# Patient Record
Sex: Male | Born: 1995 | Hispanic: No | Marital: Single | Smoking: Never smoker
Health system: Southern US, Community
[De-identification: ages and names within clinical notes are randomized; demographics above are authoritative.]

## PROBLEM LIST (undated history)

## (undated) HISTORY — PX: KNEE ARTHROSCOPY WITH ANTERIOR CRUCIATE LIGAMENT (ACL) REPAIR: SHX5644

---
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
## 2015-09-23 ENCOUNTER — Encounter (HOSPITAL_BASED_OUTPATIENT_CLINIC_OR_DEPARTMENT_OTHER): Payer: Self-pay | Admitting: Emergency Medicine

## 2015-09-23 ENCOUNTER — Emergency Department (HOSPITAL_BASED_OUTPATIENT_CLINIC_OR_DEPARTMENT_OTHER)
Admission: EM | Admit: 2015-09-23 | Discharge: 2015-09-23 | Discharge status: Against Medical Advice | Payer: Self-pay | Attending: Emergency Medicine | Admitting: Emergency Medicine

## 2015-09-23 DIAGNOSIS — S0592XA Unspecified injury of left eye and orbit, initial encounter: Secondary | ICD-10-CM | POA: Insufficient documentation

## 2015-09-23 DIAGNOSIS — W228XXA Striking against or struck by other objects, initial encounter: Secondary | ICD-10-CM | POA: Insufficient documentation

## 2015-09-23 DIAGNOSIS — Y9289 Other specified places as the place of occurrence of the external cause: Secondary | ICD-10-CM | POA: Insufficient documentation

## 2015-09-23 DIAGNOSIS — Y998 Other external cause status: Secondary | ICD-10-CM | POA: Insufficient documentation

## 2015-09-23 DIAGNOSIS — Y9389 Activity, other specified: Secondary | ICD-10-CM | POA: Insufficient documentation

## 2015-09-23 NOTE — ED Notes (Signed)
Pt reports he is feeling better and wishes to leave. Advised he can return at any time. Pt cao x 4. Ambulatory with steady gait.
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
## 2016-08-20 ENCOUNTER — Encounter (HOSPITAL_BASED_OUTPATIENT_CLINIC_OR_DEPARTMENT_OTHER): Payer: Self-pay

## 2016-08-20 ENCOUNTER — Emergency Department (HOSPITAL_BASED_OUTPATIENT_CLINIC_OR_DEPARTMENT_OTHER): Payer: Self-pay

## 2016-08-20 ENCOUNTER — Emergency Department (HOSPITAL_BASED_OUTPATIENT_CLINIC_OR_DEPARTMENT_OTHER)
Admission: EM | Admit: 2016-08-20 | Discharge: 2016-08-20 | Disposition: A | Discharge status: Home / Self Care | Payer: Self-pay | Attending: Emergency Medicine | Admitting: Emergency Medicine

## 2016-08-20 DIAGNOSIS — Y9366 Activity, soccer: Secondary | ICD-10-CM | POA: Insufficient documentation

## 2016-08-20 DIAGNOSIS — Y929 Unspecified place or not applicable: Secondary | ICD-10-CM | POA: Insufficient documentation

## 2016-08-20 DIAGNOSIS — S8391XA Sprain of unspecified site of right knee, initial encounter: Secondary | ICD-10-CM | POA: Insufficient documentation

## 2016-08-20 DIAGNOSIS — Y999 Unspecified external cause status: Secondary | ICD-10-CM | POA: Insufficient documentation

## 2016-08-20 DIAGNOSIS — X501XXA Overexertion from prolonged static or awkward postures, initial encounter: Secondary | ICD-10-CM | POA: Insufficient documentation

## 2016-08-20 MED ORDER — IBUPROFEN 600 MG PO TABS: 600.0000 mg | ORAL_TABLET | Freq: Four times a day (QID) | ORAL | 0 refills | Status: AC | PRN

## 2016-08-20 MED ORDER — IBUPROFEN 400 MG PO TABS
600.0000 mg | ORAL_TABLET | Freq: Once | ORAL | Status: AC
  Administered 2016-08-20: 600 mg via ORAL

## 2016-08-20 MED ORDER — IBUPROFEN 200 MG PO TABS
ORAL_TABLET | ORAL | Status: DC
Start: 2016-08-20 — End: 2016-08-21
  Filled 2016-08-20: qty 1

## 2016-08-20 MED ORDER — IBUPROFEN 400 MG PO TABS
ORAL_TABLET | ORAL | Status: AC
  Filled 2016-08-20: qty 1

## 2016-08-20 NOTE — ED Provider Notes (Addendum)
[facility] Provider Note   CSN: [id number] Arrival date & time: 08/20/16  2106  By signing my name below, I, Jasmyn B. Alexander, attest that this documentation has been prepared under the direction and in the presence of Pricilla LovelessScott Solly Derasmo, MD. Electronically Signed: Gillis EndsJasmyn B. Lyn HollingsheadAlexander, ED Scribe. 08/20/16. 10:45 PM.  History   Chief Complaint Chief Complaint  Patient presents with  . Knee Injury    The history is provided by the patient. No language interpreter was used.   HPI Comments: Mitchell Elliott is a 20 y.o. male who presents to the Emergency Department complaining of sudden onset, constant, severe right knee pain x 3 hrs PTA. Pt states he was running while playing soccer, and when he did a quick twisting movement he heard a "pop" in his knee. Pt has associated swelling of his right knee. Pain is exacerbated when he bears weight and with movement of his right leg. No alleviating factors noted. Denies any numbness to right lower extremity.   History reviewed. No pertinent past medical history.  There are no active problems to display for this patient.  History reviewed. No pertinent surgical history.   Home Medications    Prior to Admission medications   Medication Sig Start Date End Date Taking? Authorizing Provider  ibuprofen (ADVIL,MOTRIN) 600 MG tablet Take 1 tablet (600 mg total) by mouth every 6 (six) hours as needed. 08/20/16   Pricilla LovelessScott Tene Gato, MD    Family History No family history on file.  Social History Social History  Substance Use Topics  . Smoking status: Never Smoker  . Smokeless tobacco: Never Used  . Alcohol use No    Allergies   Review of patient's allergies indicates no known allergies.   Review of Systems Review of Systems  Musculoskeletal: Positive for joint swelling and myalgias.  Neurological: Negative for numbness.  All other systems reviewed and are negative.  Physical Exam Updated Vital Signs BP 115/77   Pulse 76   Temp  98.2 F (36.8 C) (Oral)   Resp 16   Ht 5\' 7"  (1.702 m)   Wt 147 lb (66.7 kg)   SpO2 100%   BMI 23.02 kg/m   Physical Exam  Constitutional: He is oriented to person, place, and time. He appears well-developed and well-nourished.  HENT:  Head: Normocephalic and atraumatic.  Right Ear: External ear normal.  Left Ear: External ear normal.  Nose: Nose normal.  Eyes: Right eye exhibits no discharge. Left eye exhibits no discharge.  Neck: Neck supple.  Cardiovascular: Normal rate and intact distal pulses.   2+ DP Pulse on the right   Pulmonary/Chest: Effort normal.  Abdominal: He exhibits no distension.  Musculoskeletal: He exhibits tenderness.       Right knee: He exhibits swelling. Tenderness found. Medial joint line and lateral joint line tenderness noted.  No significant laxity of his knee joints.  Neurological: He is alert and oriented to person, place, and time.  Skin: Skin is warm and dry.  Nursing note and vitals reviewed.  ED Treatments / Results  DIAGNOSTIC STUDIES: Oxygen Saturation is 100% on RA, normal by my interpretation.    COORDINATION OF CARE: 10:40 PM-Discussed treatment plan which includes order of Ibuprofen, leg immobilization with crutches, pain management methods and referral to Orthopedist with pt at bedside and pt agreed to plan.   Radiology Dg Knee Complete 4 Views Right  Result Date: 08/20/2016 CLINICAL DATA:  Twisting injury playing soccer today. EXAM: RIGHT KNEE - COMPLETE 4+ VIEW COMPARISON:  None. FINDINGS: No evidence of fracture, dislocation, or joint effusion. No evidence of arthropathy or other focal bone abnormality. Probable small joint effusion. IMPRESSION: Negative for fracture or dislocation. Probable small knee joint effusion. Electronically Signed   By: Ellery Plunk M.D.   On: 08/20/2016 21:50   Procedures Procedures (including critical care time)  Medications Ordered in ED Medications  ibuprofen (ADVIL,MOTRIN) 400 MG tablet (  Not  Given 08/20/16 2305)  ibuprofen (ADVIL,MOTRIN) 200 MG tablet (  Not Given 08/20/16 2304)  ibuprofen (ADVIL,MOTRIN) tablet 600 mg (600 mg Oral Given 08/20/16 2227)   Initial Impression / Assessment and Plan / ED Course  I have reviewed the triage vital signs and the nursing notes.  Pertinent labs & imaging results that were available during my care of the patient were reviewed by me and considered in my medical decision making (see chart for details).  Clinical Course    Patient presents with a right knee sprain. Moderate swelling present. Treat with NSAIDs, Tylenol, ice, elevation. He has been unable to bear weight, we'll place a knee immobilizer with ambulation as well as crutches. Follow-up with orthopedics. X-ray without fracture.  Final Clinical Impressions(s) / ED Diagnoses   Final diagnoses:  Right knee sprain, initial encounter    New Prescriptions New Prescriptions   IBUPROFEN (ADVIL,MOTRIN) 600 MG TABLET    Take 1 tablet (600 mg total) by mouth every 6 (six) hours as needed.   I personally performed the services described in this documentation, which was scribed in my presence. The recorded information has been reviewed and is accurate.     Pricilla Loveless, MD 08/20/16 8119    Pricilla Loveless, MD 08/20/16 [id number]

## 2016-08-20 NOTE — ED Triage Notes (Addendum)
Injured right ankle playing soccer just PTA-states he "heard a pop" right ankle-limping gait-placed in w/c after triage

## 2016-08-20 NOTE — ED Triage Notes (Signed)
When taken to tx area pt states that only injury/pain is to right knee-denies ankle injury-ice pack given

## 2016-08-21 ENCOUNTER — Ambulatory Visit (INDEPENDENT_AMBULATORY_CARE_PROVIDER_SITE_OTHER): Payer: Self-pay | Admitting: Family Medicine

## 2016-08-21 ENCOUNTER — Encounter: Payer: Self-pay | Admitting: Family Medicine

## 2016-08-21 DIAGNOSIS — S8991XA Unspecified injury of right lower leg, initial encounter: Secondary | ICD-10-CM

## 2016-08-21 NOTE — Patient Instructions (Signed)
I'm concerned you have an 'unhappy triad' injury of your knee - ACL, MCL, meniscus (cartilage). We will go ahead with an MRI of your knee. Wear the immobilizer when you're up and walking around. Crutches as well. Icing 15 minutes at a time 3-4 times a day. Ibuprofen 600mg  three times a day with food OR aleve 2 tabs twice a day with food for pain and inflammation. I will call you with results and next steps. Do some straight leg raises in the brace to keep hip and quad strength 3 sets of 10 once or twice a day.

## 2016-08-24 ENCOUNTER — Encounter: Payer: Self-pay | Admitting: Family Medicine

## 2016-08-26 ENCOUNTER — Telehealth: Payer: Self-pay | Admitting: Family Medicine

## 2016-08-26 DIAGNOSIS — S8991XA Unspecified injury of right lower leg, initial encounter: Secondary | ICD-10-CM | POA: Insufficient documentation
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
## 2016-08-26 NOTE — Assessment & Plan Note (Signed)
concerning for unhappy triad injury (ACL, MCL, meniscus).  Continue with immobilizer, crutches, icing, nsaids.  Shown home exercises to start.  He will obtain cone coverage to go ahead with MRI.

## 2016-08-26 NOTE — Telephone Encounter (Signed)
Spoke to patient and told him to bring insurance card to the office and we can make a copy then order MRI.

## 2016-08-27 ENCOUNTER — Telehealth: Payer: Self-pay | Admitting: Family Medicine

## 2016-08-28 NOTE — Telephone Encounter (Signed)
Spoke to patient and told him that we would let him know when we hear from the insurance company.

## 2016-08-29 NOTE — Addendum Note (Signed)
Addended by: Kathi SimpersWISE, Vera Furniss F on: 08/29/2016 10:06 AM   Modules accepted: Orders

## 2016-08-30 ENCOUNTER — Ambulatory Visit (HOSPITAL_BASED_OUTPATIENT_CLINIC_OR_DEPARTMENT_OTHER)
Admission: RE | Admit: 2016-08-30 | Discharge: 2016-08-30 | Disposition: A | Discharge status: Home / Self Care | Payer: BLUE CROSS/BLUE SHIELD | Source: Ambulatory Visit | Attending: Family Medicine | Admitting: Family Medicine

## 2016-08-30 DIAGNOSIS — S8011XA Contusion of right lower leg, initial encounter: Secondary | ICD-10-CM | POA: Insufficient documentation

## 2016-08-30 DIAGNOSIS — S7011XA Contusion of right thigh, initial encounter: Secondary | ICD-10-CM | POA: Insufficient documentation

## 2016-08-30 DIAGNOSIS — M25461 Effusion, right knee: Secondary | ICD-10-CM | POA: Diagnosis not present

## 2016-08-30 DIAGNOSIS — S8991XA Unspecified injury of right lower leg, initial encounter: Secondary | ICD-10-CM | POA: Diagnosis present

## 2016-08-30 DIAGNOSIS — S83511A Sprain of anterior cruciate ligament of right knee, initial encounter: Secondary | ICD-10-CM | POA: Insufficient documentation

## 2016-08-30 DIAGNOSIS — X58XXXA Exposure to other specified factors, initial encounter: Secondary | ICD-10-CM | POA: Diagnosis not present

## 2016-09-01 ENCOUNTER — Telehealth: Payer: Self-pay | Admitting: Family Medicine

## 2016-09-01 NOTE — Telephone Encounter (Signed)
Spoke with patient - see addendum in chart.

## 2016-09-01 NOTE — Addendum Note (Signed)
Addended by: Kathi SimpersWISE, Casmira Cramer F on: 09/01/2016 03:38 PM   Modules accepted: Orders

## 2017-02-01 ENCOUNTER — Emergency Department (HOSPITAL_BASED_OUTPATIENT_CLINIC_OR_DEPARTMENT_OTHER)
Admission: EM | Admit: 2017-02-01 | Discharge: 2017-02-01 | Disposition: A | Discharge status: Home / Self Care | Payer: BLUE CROSS/BLUE SHIELD | Attending: Emergency Medicine | Admitting: Emergency Medicine

## 2017-02-01 ENCOUNTER — Encounter (HOSPITAL_BASED_OUTPATIENT_CLINIC_OR_DEPARTMENT_OTHER): Payer: Self-pay | Admitting: Emergency Medicine

## 2017-02-01 ENCOUNTER — Emergency Department (HOSPITAL_BASED_OUTPATIENT_CLINIC_OR_DEPARTMENT_OTHER): Payer: BLUE CROSS/BLUE SHIELD

## 2017-02-01 DIAGNOSIS — J329 Chronic sinusitis, unspecified: Secondary | ICD-10-CM | POA: Insufficient documentation

## 2017-02-01 DIAGNOSIS — R05 Cough: Secondary | ICD-10-CM

## 2017-02-01 DIAGNOSIS — R059 Cough, unspecified: Secondary | ICD-10-CM

## 2017-02-01 MED ORDER — GUAIFENESIN-CODEINE 100-10 MG/5ML PO SOLN: 5.0000 mL | Freq: Three times a day (TID) | ORAL | 0 refills | Status: AC | PRN

## 2017-02-01 MED ORDER — ACETAMINOPHEN 325 MG PO TABS
650.0000 mg | ORAL_TABLET | Freq: Once | ORAL | Status: AC | PRN
  Administered 2017-02-01: 650 mg via ORAL
  Filled 2017-02-01: qty 2

## 2017-02-01 MED ORDER — AMOXICILLIN-POT CLAVULANATE 875-125 MG PO TABS
1.0000 | ORAL_TABLET | Freq: Two times a day (BID) | ORAL | 0 refills | Status: AC
Start: 2017-02-01 — End: ?

## 2017-02-01 NOTE — Discharge Instructions (Signed)
Your chest x-ray was normal. This is likely a bacterial sinus infection. Please take the Augmentin 2 times a day for 7 days for infection. Have given used some cough syrup. This does have some pain medicine in it so it will make you drowsy. Do not drive with this medication. Use over-the-counter Mucinex for chest congestion. Take Motrin and Tylenol for fever and pain. Make sure you're staying hydrated with plenty of fluids. Follow-up with her primary care doctor or return to the ED if your symptoms worsen.

## 2017-02-01 NOTE — ED Notes (Addendum)
EDPA into room, pt alert, NAD, calm, interactive, resps e/u, speaking in clear complete sentences, skin W&D, no dyspnea noted. Reports nasal congestion, and teeth pain upper > lower, L > R.

## 2017-02-01 NOTE — ED Provider Notes (Signed)
[facility]   CSN: [id number] Arrival date & time: 02/01/17  1620  By signing my name below, I, Modena Jansky, attest that this documentation has been prepared under the direction and in the presence of non-physician practitioner, Azucena Kuba, PA-C. Electronically Signed: Modena Jansky, Scribe. 02/01/2017. 6:29 PM.  History   Chief Complaint Chief Complaint  Patient presents with  . Cough   The history is provided by the patient. No language interpreter was used.   HPI Comments: Mitchell Elliott is a 21 y.o. male who presents to the Emergency Department complaining of intermittent moderate cough that started about a week ago. He states he has been having gradually worsening URI-like symptoms. He started having dental pain yesterday that worsened today. He complains of sinus pressure and congestion. States that the sinus pressure is radiating to his teeth. Endorses thick yellow nasal discharge. Significant congestion. He has been taking nyquil, dayquil, and tylenol with minimal relief. He describes the cough as productive. He reports associated fever (subjective), nasal congestion, and generalized myalgias. Pt's temperature in the ED today was 101.4. He denies any influenza vaccination this year, ear pain, sore throat , chest pain, shortness of breath, abdominal pain, nausea, emesis, lightheadedness, dizziness, vision changes or other complaints.    History reviewed. No pertinent past medical history.  Patient Active Problem List   Diagnosis Date Noted  . Right knee injury 08/26/2016    Past Surgical History:  Procedure Laterality Date  . KNEE ARTHROSCOPY WITH ANTERIOR CRUCIATE LIGAMENT (ACL) REPAIR         Home Medications    Prior to Admission medications   Medication Sig Start Date End Date Taking? Authorizing Provider  albuterol (PROVENTIL HFA;VENTOLIN HFA) 108 (90 BASE) MCG/ACT inhaler Inhale 2 puffs into the lungs every 4 (four) hours as needed for  shortness of breath. 11/05/13   Amber Nydia Bouton, MD  ibuprofen (ADVIL,MOTRIN) 600 MG tablet Take 1 tablet (600 mg total) by mouth every 6 (six) hours as needed. 08/20/16   Pricilla Loveless, MD    Family History History reviewed. No pertinent family history.  Social History Social History  Substance Use Topics  . Smoking status: Never Smoker  . Smokeless tobacco: Never Used  . Alcohol use No     Allergies   Patient has no known allergies.   Review of Systems Review of Systems  Constitutional: Positive for fever (Subjective\).  HENT: Positive for congestion (Nasal). Negative for ear pain and sore throat.   Respiratory: Positive for cough.   Musculoskeletal: Positive for myalgias (Generalized).  All other systems reviewed and are negative.    Physical Exam Updated Vital Signs Vitals:   02/01/17 1939 02/01/17 1944  BP: 114/65   Pulse: 86   Resp:    Temp:  99.1 F (37.3 C)     Physical Exam  Constitutional: He is oriented to person, place, and time. He appears well-developed and well-nourished. No distress.  HENT:  Head: Normocephalic and atraumatic.  Right Ear: Tympanic membrane, external ear and ear canal normal.  Left Ear: Tympanic membrane, external ear and ear canal normal.  Nose: Mucosal edema and rhinorrhea (thick yellow drainage) present. Right sinus exhibits maxillary sinus tenderness and frontal sinus tenderness. Left sinus exhibits maxillary sinus tenderness and frontal sinus tenderness.  Mouth/Throat: Uvula is midline, oropharynx is clear and moist and mucous membranes are normal. No trismus in the jaw. Normal dentition. No dental abscesses.  Dentition appears normal  Eyes: Conjunctivae and EOM are normal. Pupils  are equal, round, and reactive to light.  Neck: Normal range of motion. Neck supple.  Cardiovascular: Normal rate and regular rhythm.   Pulmonary/Chest: Effort normal and breath sounds normal. No respiratory distress. He has no wheezes. He exhibits  no tenderness.  Abdominal: Soft.  Musculoskeletal: Normal range of motion.  Lymphadenopathy:    He has no cervical adenopathy.  Neurological: He is alert and oriented to person, place, and time.  Skin: Skin is warm and dry. Capillary refill takes less than 2 seconds.  Psychiatric: He has a normal mood and affect.  Nursing note and vitals reviewed.    ED Treatments / Results  DIAGNOSTIC STUDIES: Oxygen Saturation is 99% on RA, normal by my interpretation.    COORDINATION OF CARE: 6:33 PM- Pt advised of plan for treatment and pt agrees.  Labs (all labs ordered are listed, but only abnormal results are displayed) Labs Reviewed - No data to display  EKG  EKG Interpretation None       Radiology No results found.  Procedures Procedures (including critical care time)  Medications Ordered in ED Medications  acetaminophen (TYLENOL) tablet 650 mg (650 mg Oral Given 02/01/17 1637)     Initial Impression / Assessment and Plan / ED Course  I have reviewed the triage vital signs and the nursing notes.  Pertinent labs & imaging results that were available during my care of the patient were reviewed by me and considered in my medical decision making (see chart for details).     Patient presents to the ED with severe sinus pressure/congestion, thick yellow nasal discharge, subjective fevers, productive cough. Symptoms have been ongoing for 7 days. Chest x-ray shows no signs of focal infiltrate. Patient is febrile in triage. And mildly tachycardic. He was given Tylenol. Tachycardia and fever improved. Patient is nontoxic appearing. Exam consistent with sinusitis. Patient with significant pressure and tenderness over the maxillary sinuses. Given sinus tenderness and thick yellow discharge will treat with antibiotics. Patient also encouraged to use nasal saline washes. Patient complaining of symptoms of sinusitis.  Concern for acute bacterial rhinosinusitis.  Patient discharged with  Augmentin.  Instructions given for warm saline nasal wash and recommendations for follow-up with primary care physician. Patient does report fever and cough. Possible symptoms of influenza. Patient also had the treatment window for Tamiflu. Encouraged systematic treatment at home. Patient is agreeable to the above plan. Vital signs are normal discharge. He is hemodynamically stable in no acute distress. Encouraged follow-up with PCP and strict return cautions.     Final Clinical Impressions(s) / ED Diagnoses   Final diagnoses:  None    New Prescriptions Discharge Medication List as of 02/01/2017  7:47 PM    START taking these medications   Details  amoxicillin-clavulanate (AUGMENTIN) 875-125 MG tablet Take 1 tablet by mouth 2 (two) times daily., Starting Sun 02/01/2017, Print    guaiFENesin-codeine 100-10 MG/5ML syrup Take 5 mLs by mouth 3 (three) times daily as needed for cough., Starting Sun 02/01/2017, Print      I personally performed the services described in this documentation, which was scribed in my presence. The recorded information has been reviewed and is accurate.     Rise MuKenneth T Luzelena Heeg, PA-C 02/04/17 1023    Tilden FossaElizabeth Rees, MD 02/06/17 [phone]

## 2017-02-01 NOTE — ED Triage Notes (Addendum)
Pt reports cough, fever, sore throat, bodyaches, and weakness x1 week.  Pt alert and oriented at this time. Pt has not taken any medication today.

## 2018-07-28 IMAGING — MR MR KNEE*R* W/O CM
7 series · 40 of 40 positions shown · non-contrast
Comparison: None.

CLINICAL DATA: Right knee injured playing soccer 10 days ago.
Twisting knee injury. Felt a pop with sudden onset of pain.

EXAM:
MRI OF THE RIGHT KNEE WITHOUT CONTRAST
TECHNIQUE: Multiplanar, multisequence MR imaging of the knee was performed. No
intravenous contrast was administered.

[Series 3: PD fat-sat · axial · 4.0mm · 0.50mm/px · z∈[-72,+48]mm · 6 of 25 slices shown (1 of 3)]
[im 1/25]
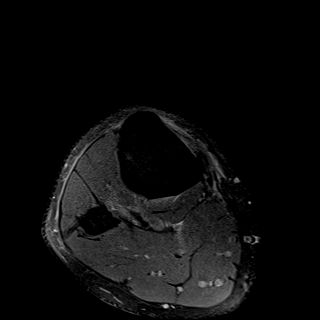
[im 5/25]
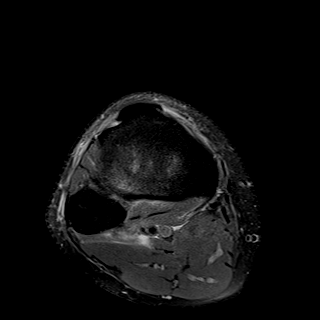
[im 10/25]
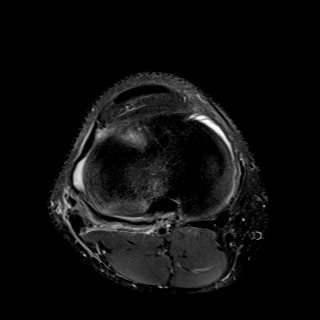
[im 15/25]
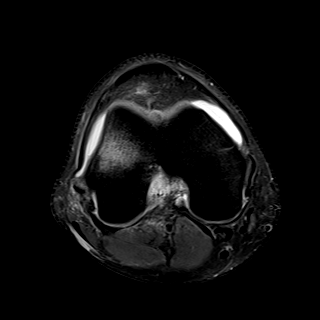
[im 20/25]
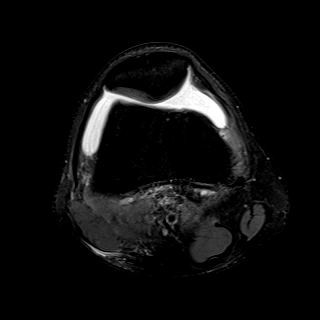
[im 25/25]
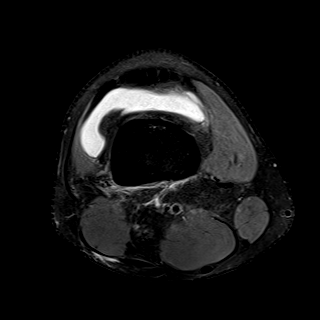

[Series 4: PD fat-sat · coronal · 4.0mm · 0.47mm/px · 6 of 23 slices shown (2 of 3)]
[im 1/23]
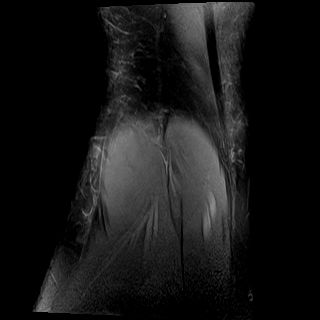
[im 5/23]
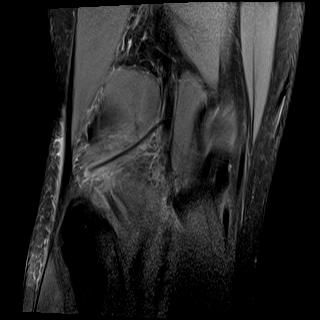
[im 9/23]
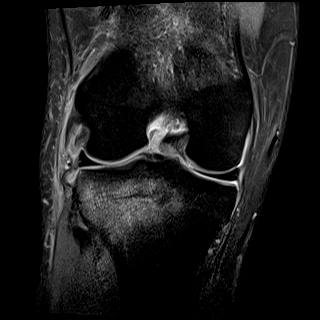
[im 14/23]
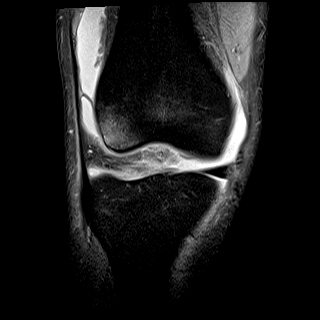
[im 18/23]
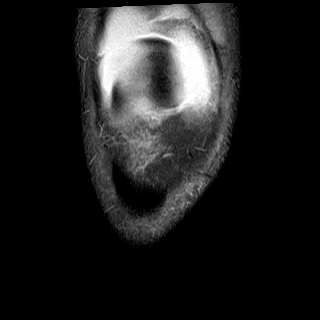
[im 23/23]
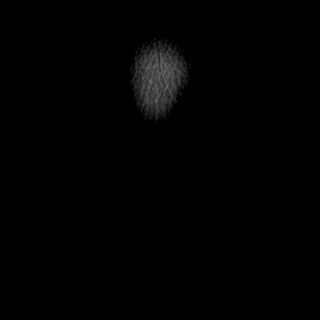

[Series 5: T2 fat-sat · coronal · 4.0mm · 0.47mm/px · 6 of 23 slices shown (1 of 2)]
[im 1/23]
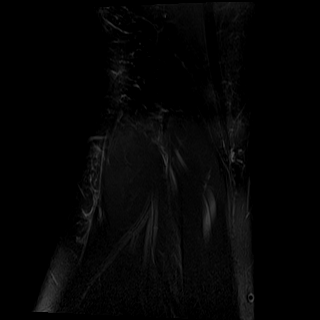
[im 5/23]
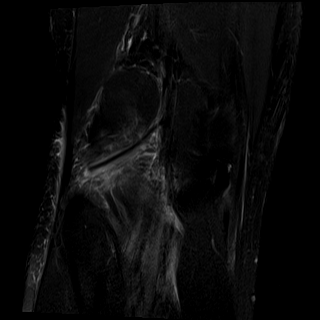
[im 9/23]
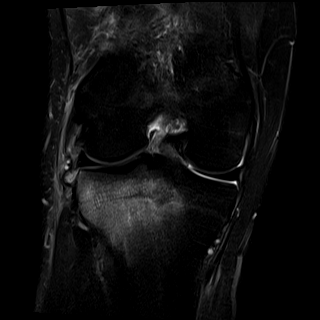
[im 14/23]
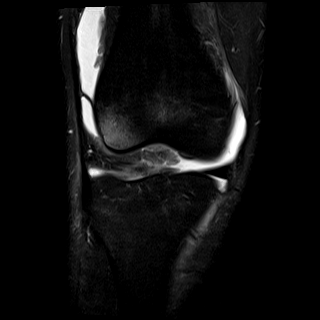
[im 18/23]
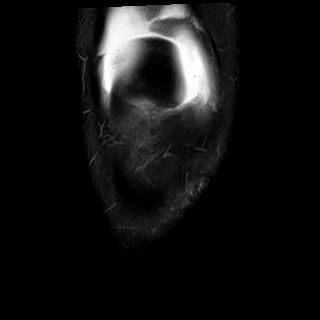
[im 23/23]
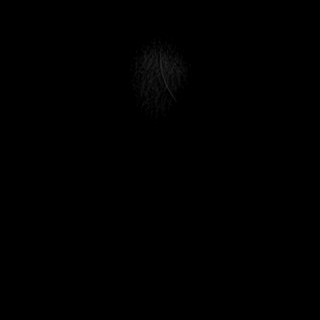

[Series 6: T1 · coronal · 4.0mm · 0.39mm/px · 6 of 23 slices shown]
[im 1/23]
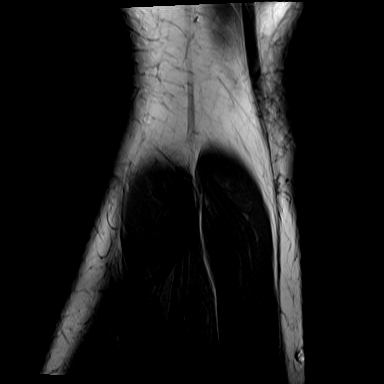
[im 5/23]
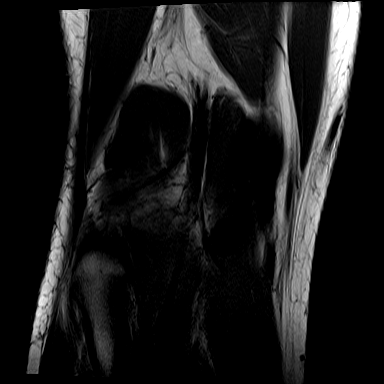
[im 9/23]
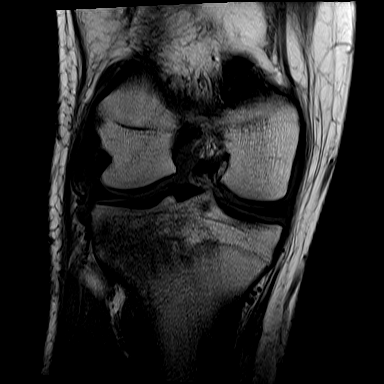
[im 14/23]
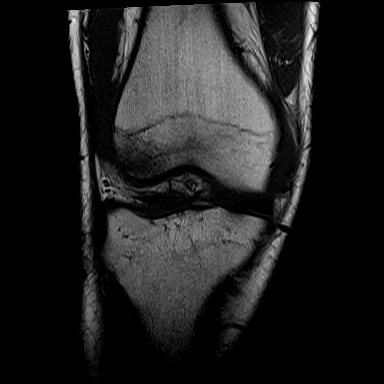
[im 18/23]
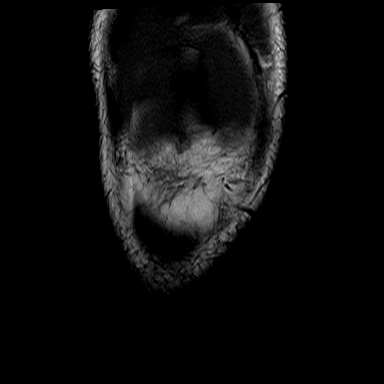
[im 23/23]
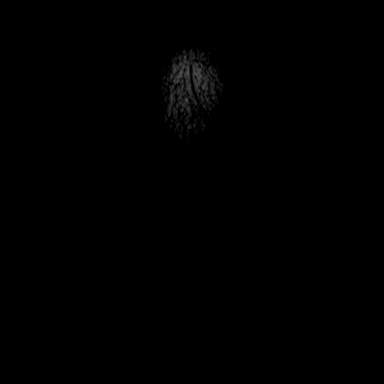

[Series 7: PD fat-sat · sagittal · 4.0mm · 0.47mm/px · 6 of 24 slices shown (3 of 3)]
[im 1/24]
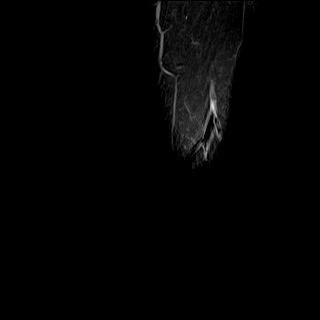
[im 5/24]
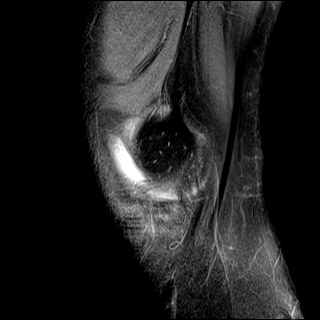
[im 10/24]
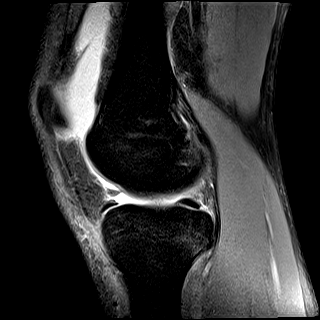
[im 14/24]
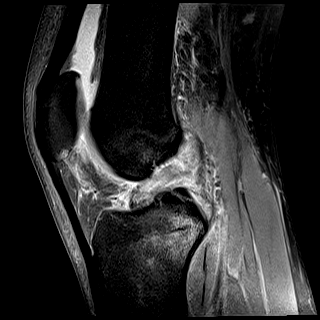
[im 19/24]
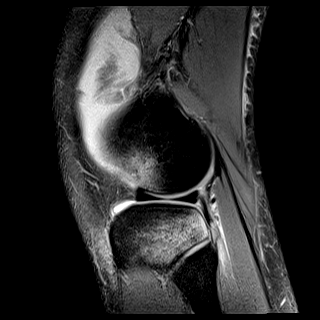
[im 24/24]
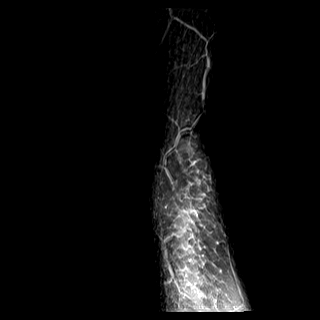

[Series 8: T2 fat-sat · coronal · 4.0mm · 0.47mm/px · 6 of 23 slices shown (2 of 2)]
[im 1/23]
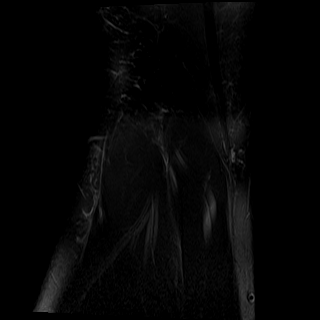
[im 5/23]
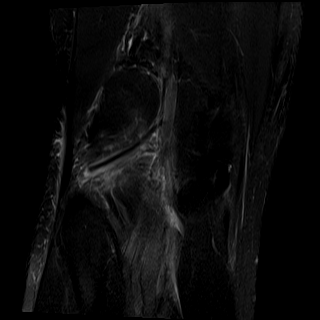
[im 9/23]
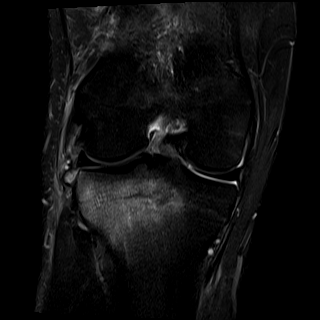
[im 14/23]
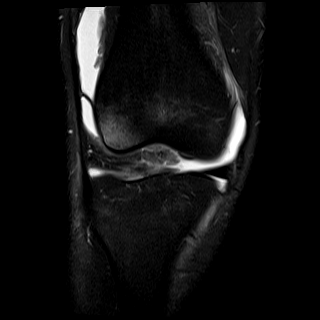
[im 18/23]
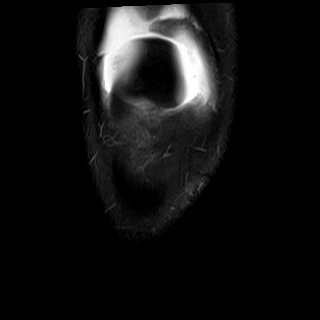
[im 23/23]
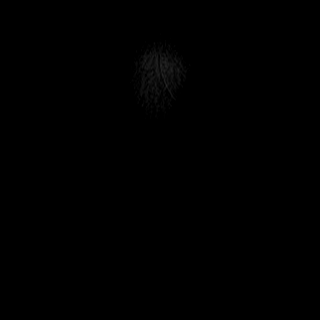

[Series 9: PD · oblique · 2.0mm · 0.50mm/px · 4 of 17 slices shown]
[im 1/17]
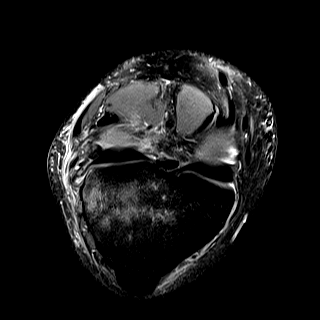
[im 6/17]
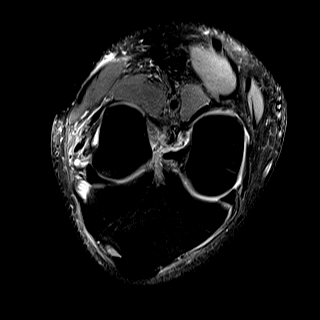
[im 11/17]
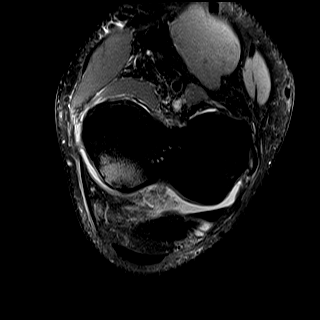
[im 17/17]
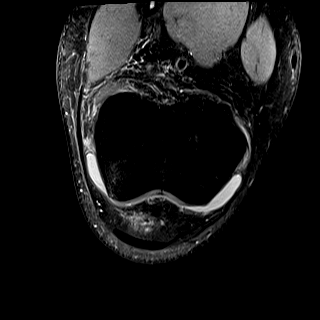

[40 of 40 positions shown; findings below may reference images not displayed]

FINDINGS: MENISCI

Medial meniscus:  Intact.

Lateral meniscus:  Intact.

LIGAMENTS

Cruciates:  Complete ACL tear.  Intact PCL.

Collaterals: Medial collateral ligament is intact. Mild thickening
of the fibular collateral ligament concerning for strain without
disruption. Remainder of the lateral collateral ligament complex is
intact.

CARTILAGE

Patellofemoral:  No chondral defect.

Medial:  No chondral defect.

Lateral:  No chondral defect.

Joint: Large joint effusion. Edema in Hoffa's fat. No plical
thickening.

Popliteal Fossa:  No Baker cyst.  Intact popliteus tendon.

Extensor Mechanism:  Intact quadriceps tendon and patellar tendon.

Bones: Severe marrow edema in the anterolateral femoral condyle and
posterolateral tibial plateau secondary to osseous contusions. No
acute fracture or dislocation.

Other: No fluid collection or hematoma.
IMPRESSION: 1. Complete, acute ACL tear.
2. Mild strain of the fibular collateral ligament without
disruption. Remainder of the lateral collateral ligament complex is
intact.
3. Large joint effusion.
4. Severe osseous contusions of the anterolateral femoral condyle
and posterolateral tibial plateau.
